# Patient Record
Sex: Male | Born: 1965 | Race: Black or African American | Hispanic: No | Smoking: Current some day smoker
Health system: Southern US, Community
[De-identification: ages and names within clinical notes are randomized; demographics above are authoritative.]

## PROBLEM LIST (undated history)

## (undated) HISTORY — PX: FRACTURE SURGERY: SHX138

## (undated) HISTORY — PX: APPENDECTOMY: SHX54

---

## 2016-10-02 ENCOUNTER — Ambulatory Visit
Admission: EM | Admit: 2016-10-02 | Discharge: 2016-10-02 | Disposition: A | Discharge status: Home / Self Care | Payer: Self-pay | Attending: Family Medicine | Admitting: Family Medicine

## 2016-10-02 ENCOUNTER — Encounter: Payer: Self-pay | Admitting: Emergency Medicine

## 2016-10-02 DIAGNOSIS — Z024 Encounter for examination for driving license: Secondary | ICD-10-CM

## 2016-10-02 LAB — DEPT OF TRANSP DIPSTICK, URINE (ARMC ONLY)
Glucose, UA: NEGATIVE mg/dL
Hgb urine dipstick: NEGATIVE
Protein, ur: NEGATIVE mg/dL
SPECIFIC GRAVITY, URINE: 1.02 (ref 1.005–1.030)

## 2016-10-02 NOTE — ED Provider Notes (Signed)
[facility]    CSN: [id number] Arrival date & time: 10/02/16  1021     History   Chief Complaint Chief Complaint  Patient presents with  . DOT Physical    HPI Wentworth Edelen is a 51 y.o. male.   Patient's 51 year old black male who is here for DOT. His appendectomy and and fractured legs surgery in the past. No current medical problems not on any chronic medications habits he does smoke and was warned he needs to stop.   The history is provided by the patient. No language interpreter was used.    History reviewed. No pertinent past medical history.  There are no active problems to display for this patient.   Past Surgical History:  Procedure Laterality Date  . APPENDECTOMY    . FRACTURE SURGERY         Home Medications    Prior to Admission medications   Not on File    Family History History reviewed. No pertinent family history.  Social History Social History  Substance Use Topics  . Smoking status: Current Some Day Smoker    Types: Cigars  . Smokeless tobacco: Never Used  . Alcohol use Yes     Allergies   Patient has no known allergies.   Review of Systems Review of Systems  All other systems reviewed and are negative.    Physical Exam Triage Vital Signs ED Triage Vitals  Enc Vitals Group     BP 10/02/16 1235 140/88     Pulse Rate 10/02/16 1235 71     Resp 10/02/16 1235 17     Temp 10/02/16 1235 98.1 F (36.7 C)     Temp Source 10/02/16 1235 Oral     SpO2 10/02/16 1235 100 %     Weight 10/02/16 1232 240 lb (108.9 kg)     Height 10/02/16 1232 5' 7.5" (1.715 m)     Head Circumference --      Peak Flow --      Pain Score 10/02/16 1233 0     Pain Loc --      Pain Edu? --      Excl. in GC? --    No data found.   Updated Vital Signs BP 140/88 (BP Location: Left Arm)   Pulse 71   Temp 98.1 F (36.7 C) (Oral)   Resp 17   Ht 5' 7.5" (1.715 m)   Wt 240 lb (108.9 kg)   SpO2 100%   BMI 37.03 kg/m   Visual  Acuity Right Eye Distance: 20/20 uncorrected Left Eye Distance: 20/15 uncorrected Bilateral Distance: 20/15 uncorrected  Right Eye Near:   Left Eye Near:    Bilateral Near:     Physical Exam  Constitutional: He is oriented to person, place, and time. He appears well-developed and well-nourished.  HENT:  Head: Normocephalic and atraumatic.  Right Ear: External ear normal.  Left Ear: External ear normal.  Mouth/Throat: Oropharynx is clear and moist.  Eyes: Pupils are equal, round, and reactive to light. EOM are normal.  Neck: Normal range of motion. Neck supple.  Cardiovascular: Normal rate and regular rhythm.   Pulmonary/Chest: Effort normal.  Abdominal: Soft. Bowel sounds are normal. Hernia confirmed negative in the right inguinal area and confirmed negative in the left inguinal area.  Truncal obesity is present  Genitourinary: Testes normal and penis normal. Cremasteric reflex is present. Circumcised.  Musculoskeletal: Normal range of motion.  Lymphadenopathy: No inguinal adenopathy noted on the right or left side.  Neurological: He is alert and oriented to person, place, and time.  Skin: Skin is warm.  Psychiatric: He has a normal mood and affect.  Vitals reviewed.    UC Treatments / Results  Labs (all labs ordered are listed, but only abnormal results are displayed) Labs Reviewed  DEPT OF TRANSP DIPSTICK, URINE([facility] ONLY)    EKG  EKG Interpretation None       Radiology No results found.  Procedures Procedures (including critical care time)  Medications Ordered in UC Medications - No data to display  Results for orders placed or performed during the hospital encounter of 10/02/16  Dept of Transp dipstick, urine  Result Value Ref Range   Protein, ur NEGATIVE NEGATIVE mg/dL   Glucose, UA NEGATIVE NEGATIVE mg/dL   Specific Gravity, Urine 1.020 1.005 - 1.030   Hgb urine dipstick NEGATIVE NEGATIVE   Initial Impression / Assessment and Plan /  UC Course  I have reviewed the triage vital signs and the nursing notes.  Pertinent labs & imaging results that were available during my care of the patient were reviewed by me and considered in my medical decision making (see chart for details).   patient given a 2 year DOT Final Clinical Impressions(s) / UC Diagnoses   Final diagnoses:  Encounter for commercial driver medical examination (CDME)    New Prescriptions New Prescriptions   No medications on file     Controlled Substance Prescriptions [place] Controlled Substance Registry consulted? Not Applicable   Hassan RowanWade, Darya Bigler, MD 10/02/16 1400

## 2016-10-02 NOTE — ED Triage Notes (Signed)
Patient here for DOT Physical.  

## 2018-02-09 ENCOUNTER — Other Ambulatory Visit: Payer: Self-pay

## 2018-02-09 ENCOUNTER — Ambulatory Visit
Admission: EM | Admit: 2018-02-09 | Discharge: 2018-02-09 | Disposition: A | Discharge status: Home / Self Care | Payer: Worker's Compensation | Attending: Physician Assistant | Admitting: Physician Assistant

## 2018-02-09 ENCOUNTER — Ambulatory Visit (INDEPENDENT_AMBULATORY_CARE_PROVIDER_SITE_OTHER): Payer: Worker's Compensation

## 2018-02-09 ENCOUNTER — Encounter: Payer: Self-pay | Admitting: Gynecology

## 2018-02-09 DIAGNOSIS — X58XXXA Exposure to other specified factors, initial encounter: Secondary | ICD-10-CM

## 2018-02-09 DIAGNOSIS — M25572 Pain in left ankle and joints of left foot: Secondary | ICD-10-CM

## 2018-02-09 DIAGNOSIS — S93401A Sprain of unspecified ligament of right ankle, initial encounter: Secondary | ICD-10-CM | POA: Insufficient documentation

## 2018-02-09 DIAGNOSIS — M79604 Pain in right leg: Secondary | ICD-10-CM

## 2018-02-09 DIAGNOSIS — S99911A Unspecified injury of right ankle, initial encounter: Secondary | ICD-10-CM

## 2018-02-09 DIAGNOSIS — M25471 Effusion, right ankle: Secondary | ICD-10-CM

## 2018-02-09 DIAGNOSIS — W19XXXA Unspecified fall, initial encounter: Secondary | ICD-10-CM

## 2018-02-09 NOTE — ED Provider Notes (Signed)
[facility]    CSN: [id number] Arrival date & time: 02/09/18  1116     History   Chief Complaint No chief complaint on file.   HPI Vincent Wu is a 52 y.o. male. Patient states that he twisted his right ankle at work and fell yesterday. He has a lot pain with weight bearing--states pain is 9/10. He has swelling and pain of the lateral right foot, ankle, and lower leg. He denies numbness and tingling.  He has not taken any medications. He denies any further injuries and has no other complaints today.  HPI  History reviewed. No pertinent past medical history.  There are no active problems to display for this patient.   Past Surgical History:  Procedure Laterality Date  . APPENDECTOMY    . FRACTURE SURGERY         Home Medications    Prior to Admission medications   Not on File    Family History Family History  Problem Relation Age of Onset  . Cancer Mother        ovarian  . Pneumonia Father     Social History Social History   Tobacco Use  . Smoking status: Current Some Day Smoker    Types: Cigars  . Smokeless tobacco: Never Used  Substance Use Topics  . Alcohol use: Yes  . Drug use: No     Allergies   Patient has no known allergies.   Review of Systems Review of Systems  Constitutional: Negative for fatigue and fever.  Cardiovascular: Positive for leg swelling.  Gastrointestinal: Negative for abdominal pain, nausea and vomiting.  Musculoskeletal: Positive for arthralgias, gait problem and joint swelling. Negative for back pain and myalgias.  Skin: Negative for color change, rash and wound.  Neurological: Negative for dizziness, weakness and light-headedness.  Hematological: Does not bruise/bleed easily.     Physical Exam Triage Vital Signs ED Triage Vitals  Enc Vitals Group     BP 02/09/18 1152 (!) 158/107     Pulse Rate 02/09/18 1152 83     Resp 02/09/18 1152 16     Temp 02/09/18 1152 98.3 F (36.8 C)     Temp Source  02/09/18 1152 Oral     SpO2 02/09/18 1152 99 %     Weight 02/09/18 1154 244 lb (110.7 kg)     Height 02/09/18 1154 5\' 6"  (1.676 m)     Head Circumference --      Peak Flow --      Pain Score 02/09/18 1154 9     Pain Loc --      Pain Edu? --      Excl. in GC? --    No data found.  Updated Vital Signs BP (!) 158/107 (BP Location: Left Arm)   Pulse 83   Temp 98.3 F (36.8 C) (Oral)   Resp 16   Ht 5\' 6"  (1.676 m)   Wt 244 lb (110.7 kg)   SpO2 99%   BMI 39.38 kg/m   Physical Exam Vitals signs and nursing note reviewed.  Constitutional:      General: He is not in acute distress.    Appearance: Normal appearance. He is normal weight. He is not ill-appearing.  HENT:     Head: Normocephalic and atraumatic.  Eyes:     General: No scleral icterus.       Right eye: No discharge.        Left eye: No discharge.     Conjunctiva/sclera: Conjunctivae  normal.  Cardiovascular:     Rate and Rhythm: Normal rate and regular rhythm.     Pulses: Normal pulses.     Heart sounds: No murmur.  Pulmonary:     Effort: Pulmonary effort is normal. No respiratory distress.     Breath sounds: Normal breath sounds. No wheezing or rhonchi.  Musculoskeletal:     Comments: RIGHT LOWER EXTREMITY: Antalgic gait. There is moderate swelling of the lateral foot, ankle and distal tibia. No ecchymosis or deformity. There is diffuse moderate tenderness to palpation of the lateral foot, lateral malleolus, and lateral distal tibia. Full ROM but pain with flexion of foot and inversion. 4/5 strength of foot. Normal sensation, NVI  Skin:    General: Skin is warm and dry.     Capillary Refill: Capillary refill takes less than 2 seconds.  Neurological:     General: No focal deficit present.     Mental Status: He is alert and oriented to person, place, and time.     Sensory: No sensory deficit.     Motor: No weakness.     Gait: Gait abnormal.  Psychiatric:        Mood and Affect: Mood normal.        Behavior:  Behavior normal.      UC Treatments / Results  Labs (all labs ordered are listed, but only abnormal results are displayed) Labs Reviewed - No data to display  EKG None  Radiology Dg Tibia/fibula Right  Result Date: 02/09/2018 CLINICAL DATA:  RIGHT leg pain post fall EXAM: RIGHT TIBIA AND FIBULA - 2 VIEW COMPARISON:  None FINDINGS: Osseous mineralization normal. Joint spaces preserved. Small focus of endosteal sclerosis at the anterior proximal RIGHT tibia, generally benign in appearance. No fracture, dislocation, or bone destruction. IMPRESSION: No acute osseous abnormalities. Electronically Signed   By: Ulyses SouthwardMark  Boles M.D.   On: 02/09/2018 13:14   Dg Ankle Complete Right  Result Date: 02/09/2018 CLINICAL DATA:  RIGHT leg pain post fall EXAM: RIGHT ANKLE - COMPLETE 3+ VIEW COMPARISON:  None. FINDINGS: Soft tissue swelling at ankle. Osseous mineralization normal. Joint spaces preserved. No fracture, dislocation, or bone destruction. IMPRESSION: No acute osseous abnormalities. Electronically Signed   By: Ulyses SouthwardMark  Boles M.D.   On: 02/09/2018 13:15   Dg Foot Complete Right  Result Date: 02/09/2018 CLINICAL DATA:  RIGHT leg pain post fall EXAM: RIGHT FOOT COMPLETE - 3+ VIEW COMPARISON:  None FINDINGS: Osseous mineralization normal. Joint spaces preserved. No fracture, dislocation, or bone destruction. IMPRESSION: Normal exam. Electronically Signed   By: Ulyses SouthwardMark  Boles M.D.   On: 02/09/2018 13:16    Procedures Procedures (including critical care time)  Medications Ordered in UC Medications - No data to display  Initial Impression / Assessment and Plan / UC Course  I have reviewed the triage vital signs and the nursing notes.  Pertinent labs & imaging results that were available during my care of the patient were reviewed by me and considered in my medical decision making (see chart for details).    Final Clinical Impressions(s) / UC Diagnoses   Final diagnoses:  Sprain of right ankle,  unspecified ligament, initial encounter  Right ankle swelling  Injury of right ankle, initial encounter     Discharge Instructions     FOOT/ANKLE INJURY: Your x-rays today were negative for any fractures. You likely have a high grade ankle sprain. You have been provided with crutches and a supportive ankle brace. Remain non weight bearing for about 2 days  since pain is to great with weightbearing. Be sure to consistently ice and elevate the foot. Take Motrin for inflammation and Tylenol additionally if needed. F/u with Joanna Hews, NP in the next 2-3 days for re-examination or return to the clinic sooner if needed.     ED Prescriptions    None     Controlled Substance Prescriptions [place] Controlled Substance Registry consulted? Not Applicable   Gareth Morgan 02/11/18 1208

## 2018-02-09 NOTE — Discharge Instructions (Addendum)
FOOT/ANKLE INJURY: Your x-rays today were negative for any fractures. You likely have a high grade ankle sprain. You have been provided with crutches and a supportive ankle brace. Remain non weight bearing for about 2 days since pain is to great with weightbearing. Be sure to consistently ice and elevate the foot. Take Motrin for inflammation and Tylenol additionally if needed. F/u with Joanna HewsKatherine McManama, NP in the next 2-3 days for re-examination or return to the clinic sooner if needed.

## 2018-02-09 NOTE — ED Triage Notes (Signed)
Per patient c/o fell in the parking lot at work. Per patient trip over a wooden ramp and injury his right  Foot/ ankle pain.

## 2020-10-13 IMAGING — CR DG ANKLE COMPLETE 3+V*R*
3 series · 3 of 3 positions shown · non-contrast
Comparison: None.

CLINICAL DATA: RIGHT leg pain post fall

EXAM:
RIGHT ANKLE - COMPLETE 3+ VIEW

[ankle ap]
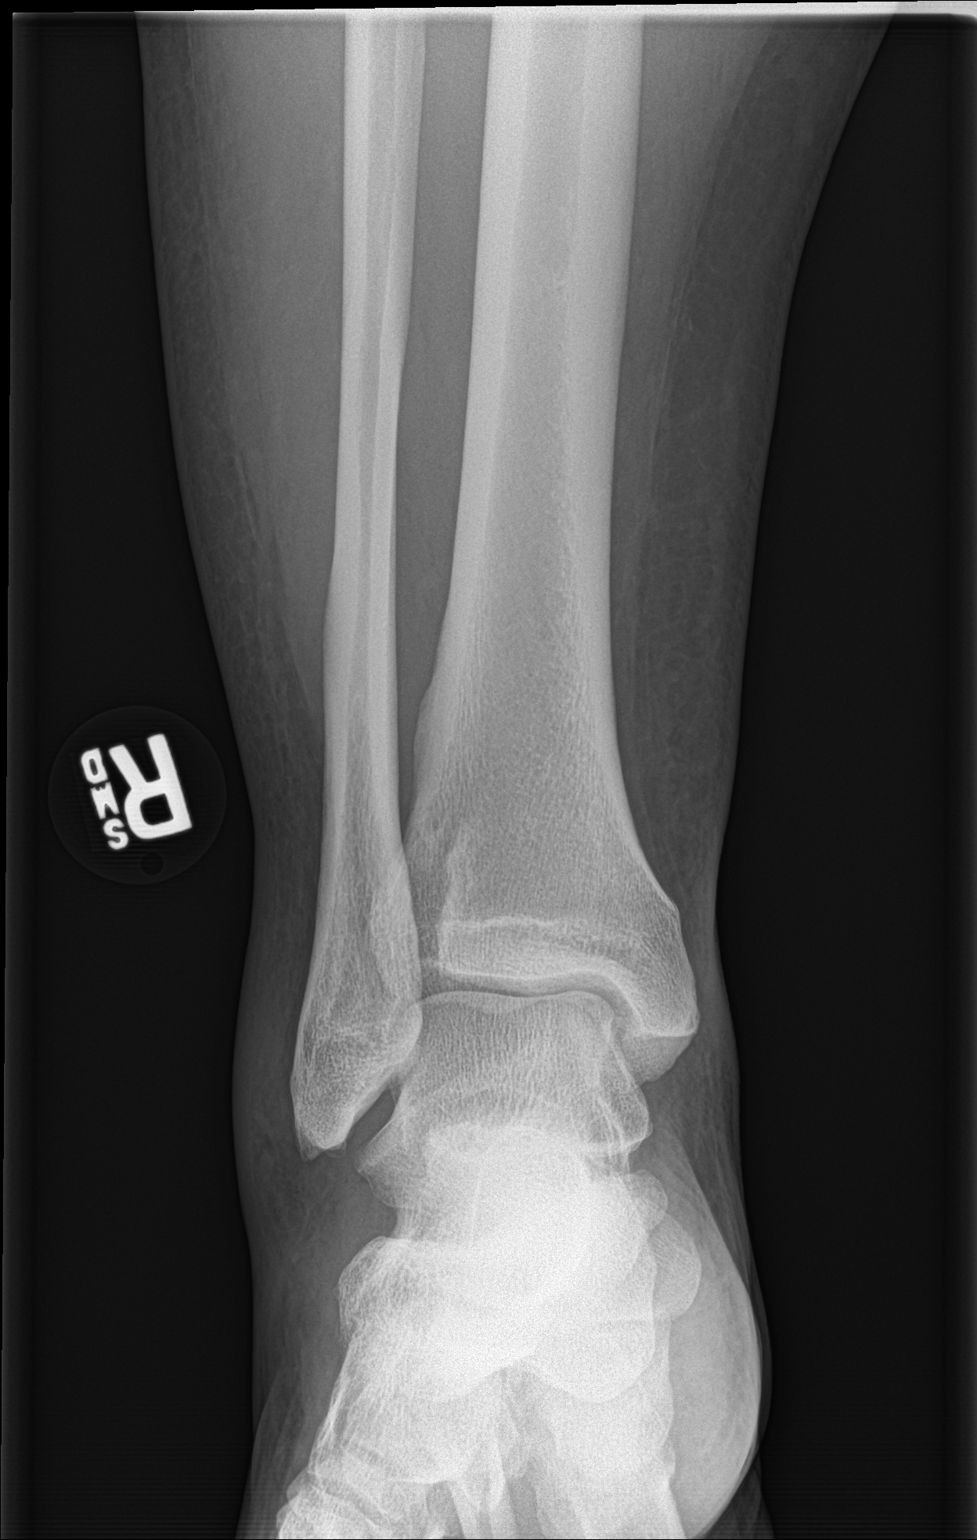

[ankle obl]
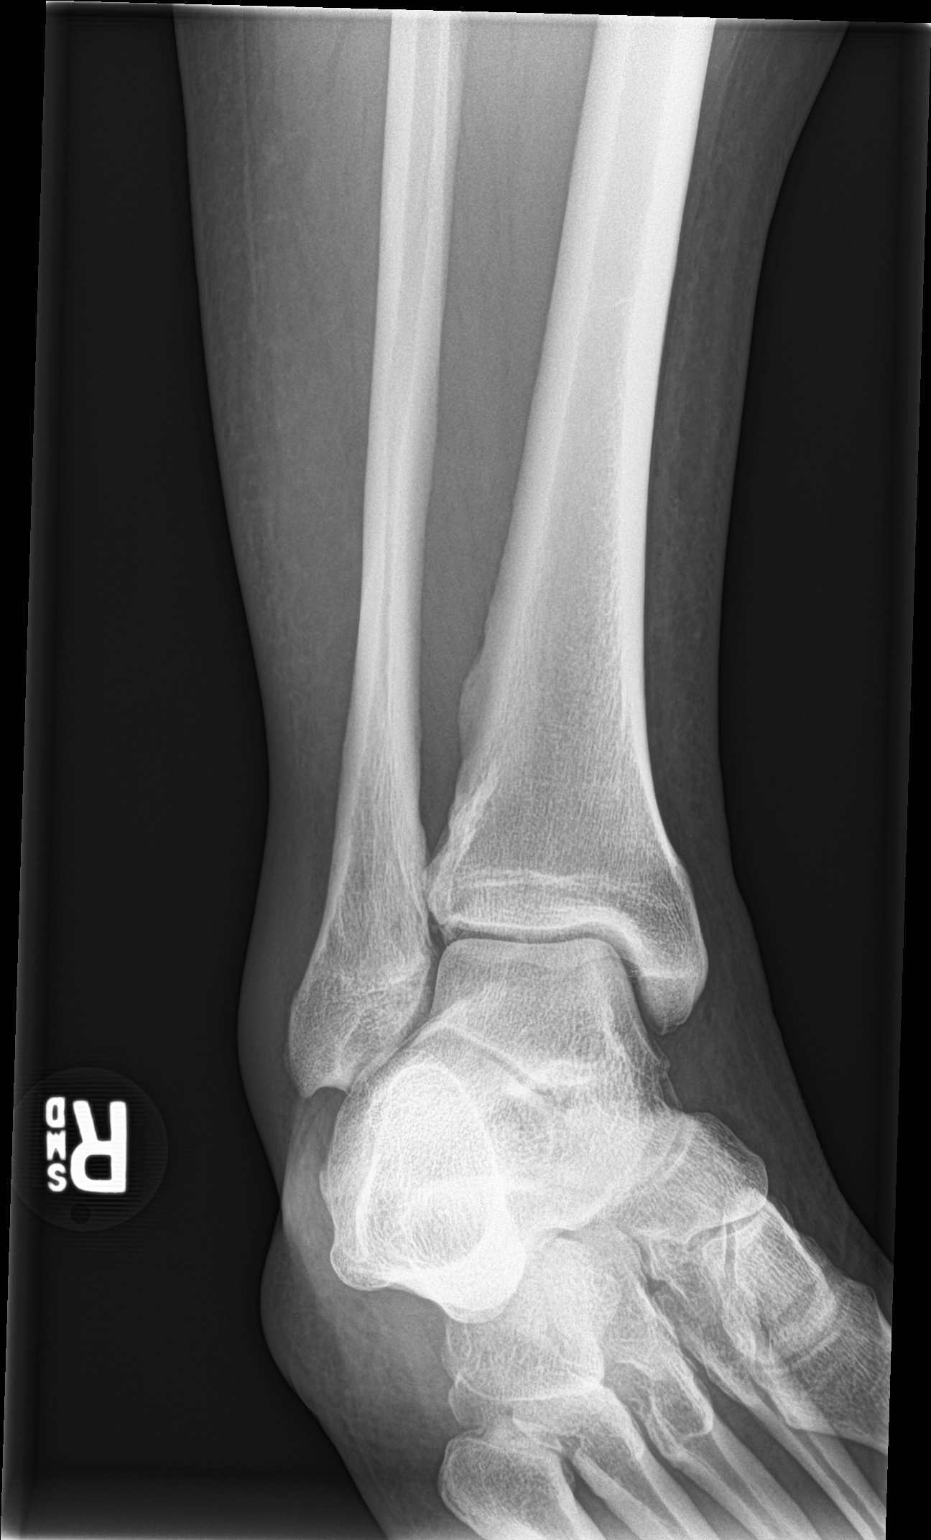

[ankle lat]
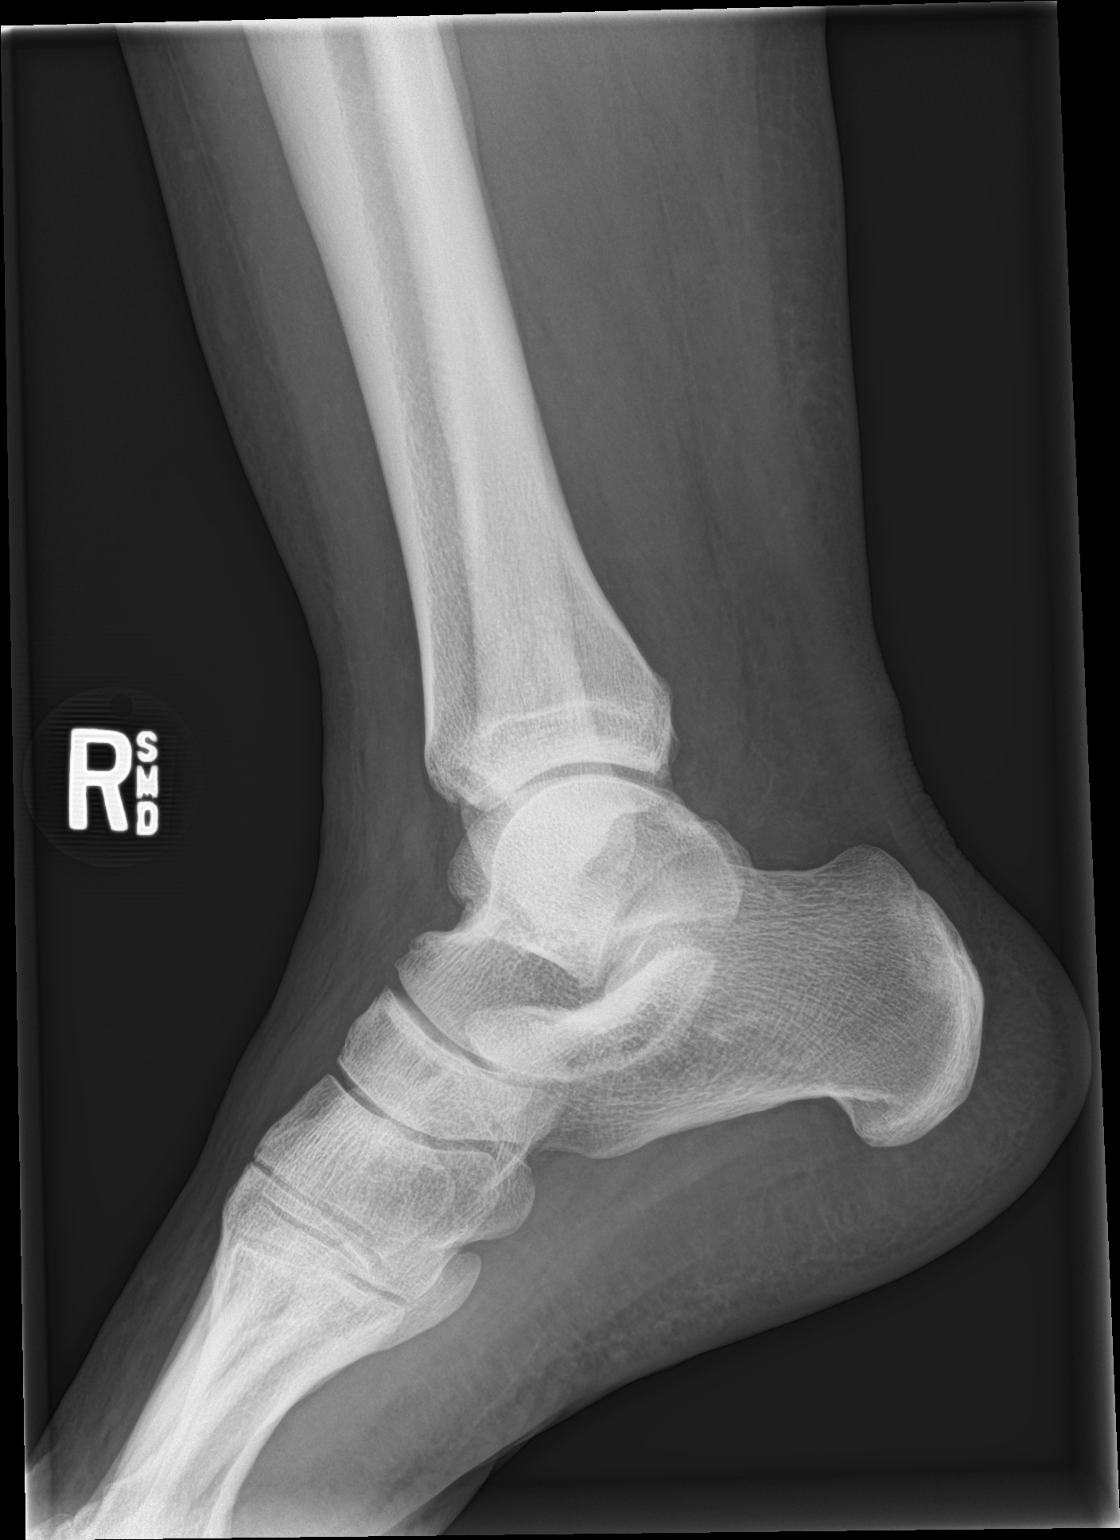

[3 of 3 positions shown; findings below may reference images not displayed]

FINDINGS: Soft tissue swelling at ankle.

Osseous mineralization normal.

Joint spaces preserved.

No fracture, dislocation, or bone destruction.
IMPRESSION: No acute osseous abnormalities.

## 2020-10-13 IMAGING — CR DG TIBIA/FIBULA 2V*R*
3 series · 3 of 3 positions shown · non-contrast
Comparison: None

CLINICAL DATA: RIGHT leg pain post fall

EXAM:
RIGHT TIBIA AND FIBULA - 2 VIEW

[tibia ap]
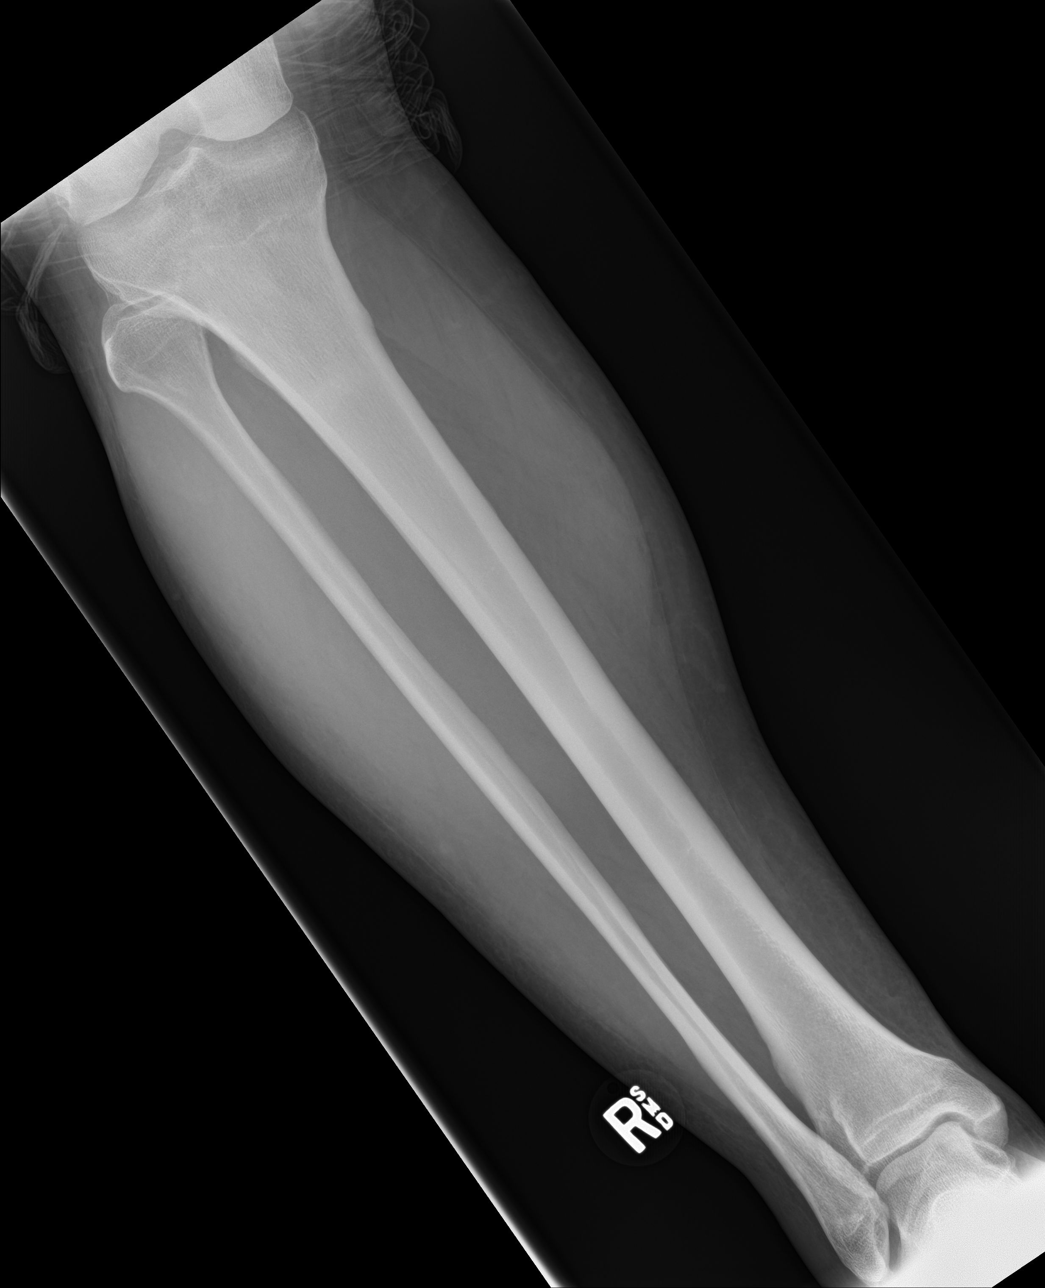

[tibia lat (1 of 2)]
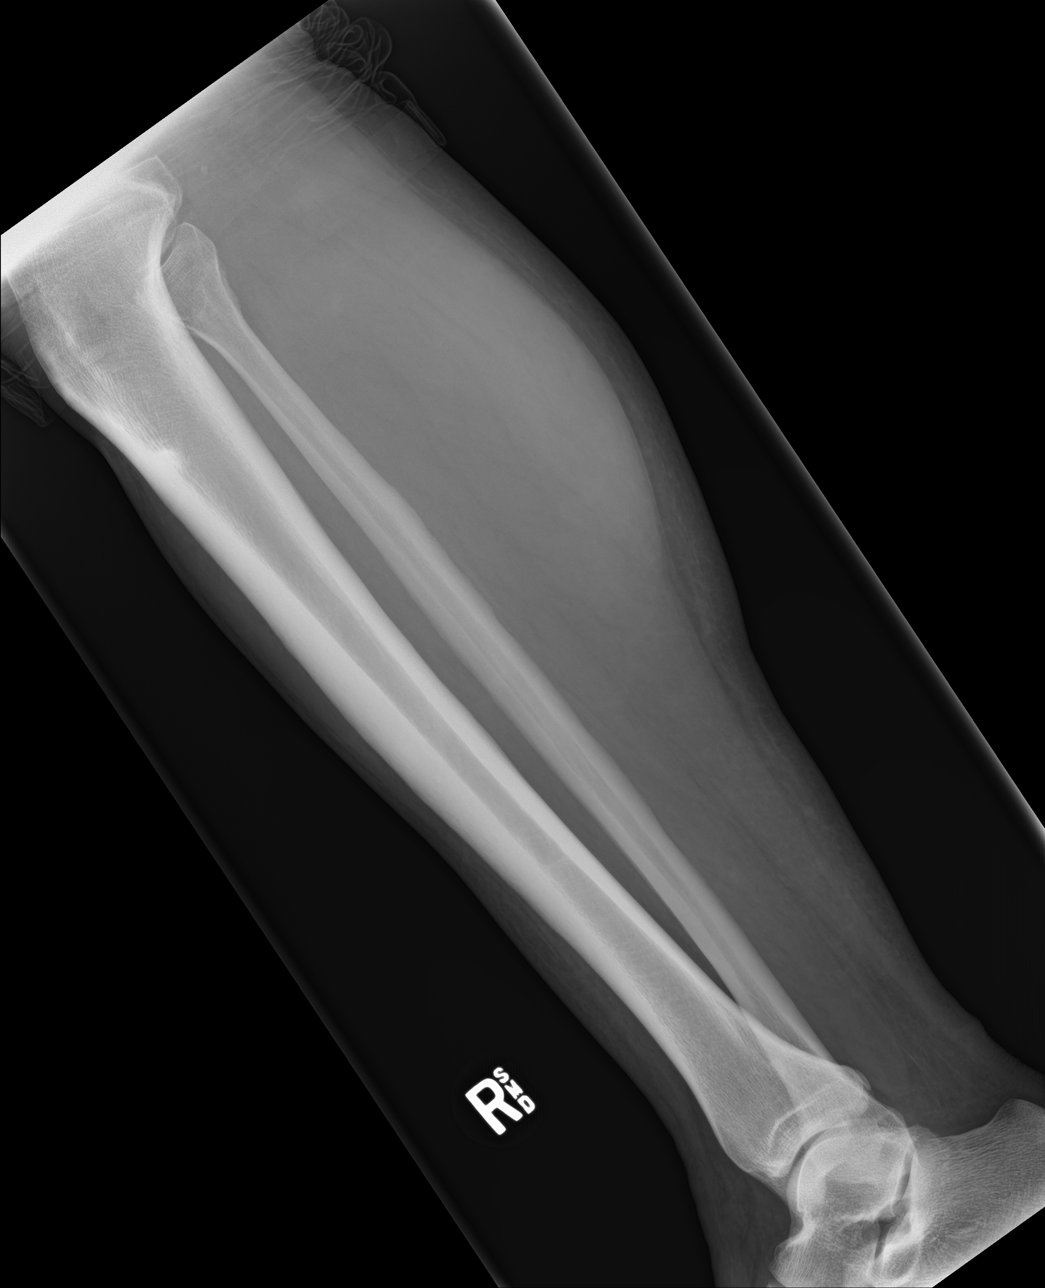

[tibia lat (2 of 2)]
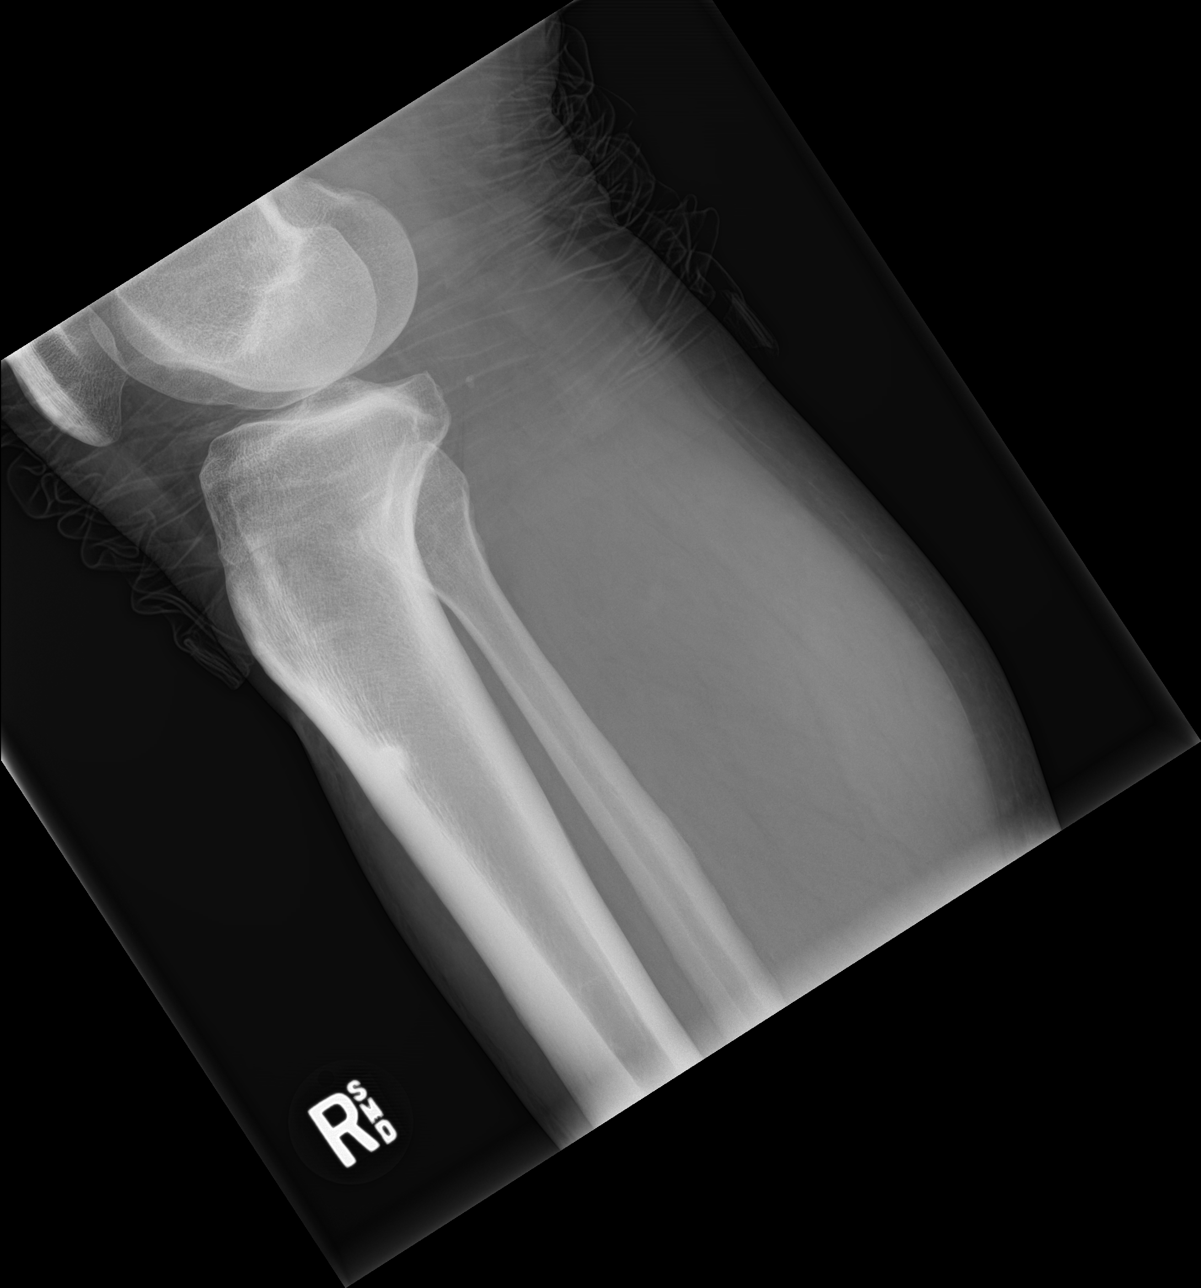

[3 of 3 positions shown; findings below may reference images not displayed]

FINDINGS: Osseous mineralization normal.

Joint spaces preserved.

Small focus of endosteal sclerosis at the anterior proximal RIGHT
tibia, generally benign in appearance.

No fracture, dislocation, or bone destruction.
IMPRESSION: No acute osseous abnormalities.

## 2022-07-07 ENCOUNTER — Other Ambulatory Visit: Payer: Self-pay | Admitting: Orthopedic Surgery

## 2022-07-07 DIAGNOSIS — S83232A Complex tear of medial meniscus, current injury, left knee, initial encounter: Secondary | ICD-10-CM

## 2022-07-07 DIAGNOSIS — M238X2 Other internal derangements of left knee: Secondary | ICD-10-CM

## 2022-07-30 ENCOUNTER — Other Ambulatory Visit: Payer: Self-pay

## 2022-08-13 ENCOUNTER — Ambulatory Visit
Admission: RE | Admit: 2022-08-13 | Discharge: 2022-08-13 | Disposition: A | Discharge status: Home / Self Care | Payer: Self-pay | Source: Ambulatory Visit | Attending: Orthopedic Surgery | Admitting: Orthopedic Surgery

## 2022-08-13 DIAGNOSIS — S83232A Complex tear of medial meniscus, current injury, left knee, initial encounter: Secondary | ICD-10-CM

## 2022-08-13 DIAGNOSIS — M238X2 Other internal derangements of left knee: Secondary | ICD-10-CM
